# Patient Record
Sex: Male | Born: 1995 | Race: Black or African American | Hispanic: No | Marital: Single | Smoking: Never smoker
Health system: Southern US, Community
[De-identification: ages and names within clinical notes are randomized; demographics above are authoritative.]

---

## 2017-07-04 ENCOUNTER — Emergency Department (HOSPITAL_COMMUNITY): Payer: Self-pay

## 2017-07-04 ENCOUNTER — Encounter (HOSPITAL_COMMUNITY): Payer: Self-pay

## 2017-07-04 ENCOUNTER — Emergency Department (HOSPITAL_COMMUNITY)
Admission: EM | Admit: 2017-07-04 | Discharge: 2017-07-04 | Disposition: A | Discharge status: Home / Self Care | Payer: Self-pay | Attending: Emergency Medicine | Admitting: Emergency Medicine

## 2017-07-04 DIAGNOSIS — R0602 Shortness of breath: Secondary | ICD-10-CM | POA: Insufficient documentation

## 2017-07-04 MED ORDER — ALBUTEROL SULFATE (2.5 MG/3ML) 0.083% IN NEBU
2.5000 mg | INHALATION_SOLUTION | Freq: Once | RESPIRATORY_TRACT | Status: AC
  Administered 2017-07-04: 2.5 mg via RESPIRATORY_TRACT
  Filled 2017-07-04: qty 3

## 2017-07-04 MED ORDER — IPRATROPIUM-ALBUTEROL 0.5-2.5 (3) MG/3ML IN SOLN
3.0000 mL | Freq: Once | RESPIRATORY_TRACT | Status: AC
  Administered 2017-07-04: 3 mL via RESPIRATORY_TRACT
  Filled 2017-07-04: qty 3

## 2017-07-04 NOTE — ED Notes (Signed)
RT notified of orders 

## 2017-07-04 NOTE — ED Notes (Signed)
Pts transportation has to leave and pt states he needs to leave. Informed Dr Estell HarpinZammit.Per Dr zammit it will approx 30 mins before he will be reevaluated. Pt does not wish to stay and explained AMA and pt wants to sign out AMA

## 2017-07-04 NOTE — ED Triage Notes (Signed)
Pt states he has had shortness of breath since last night. Denies cough, congestion, fever. States he has been having night sweats for approx 1 month.

## 2017-07-05 NOTE — ED Provider Notes (Signed)
[facility]ENCY [facility]PARTMENT Provider Note   CSN: [id number] Arrival date & time: 07/04/17  [id number]     History   Chief Complaint Chief Complaint  Patient presents with  . Shortness of Breath    HPI Doran Heaterzaiah Kaplan is a 22 y.o. male.  Patient complains of some chest pain and shortness of breath.  The pain has improved but he still has some shortness of breath   The history is provided by the patient. No language interpreter was used.  Shortness of Breath  This is a new problem. The problem occurs rarely.The current episode started 12 to 24 hours ago. The problem has not changed since onset.Pertinent negatives include no fever, no headaches, no cough, no chest pain, no abdominal pain and no rash. It is unknown what precipitated the problem.    History reviewed. No pertinent past medical history.  There are no active problems to display for this patient.   History reviewed. No pertinent surgical history.     Home Medications    Prior to Admission medications   Not on File    Family History No family history on file.  Social History Social History   Tobacco Use  . Smoking status: Never Smoker  . Smokeless tobacco: Never Used  Substance Use Topics  . Alcohol use: No    Frequency: Never  . Drug use: No     Allergies   Pineapple; Amoxicillin; and Penicillins   Review of Systems Review of Systems  Constitutional: Negative for appetite change, fatigue and fever.  HENT: Negative for congestion, ear discharge and sinus pressure.   Eyes: Negative for discharge.  Respiratory: Positive for shortness of breath. Negative for cough.   Cardiovascular: Negative for chest pain.  Gastrointestinal: Negative for abdominal pain and diarrhea.  Genitourinary: Negative for frequency and hematuria.  Musculoskeletal: Negative for back pain.  Skin: Negative for rash.  Neurological: Negative for seizures and headaches.  Psychiatric/Behavioral: Negative for hallucinations.      Physical Exam Updated Vital Signs BP 124/84 (BP Location: Left Arm)   Pulse 62   Temp 98.1 F (36.7 C) (Oral)   Resp 16   Ht 6' 3.5" (1.918 m)   Wt 90.7 kg (200 lb)   SpO2 98%   BMI 24.67 kg/m   Physical Exam  Constitutional: He is oriented to person, place, and time. He appears well-developed.  HENT:  Head: Normocephalic.  Eyes: Conjunctivae and EOM are normal. No scleral icterus.  Neck: Neck supple. No thyromegaly present.  Cardiovascular: Normal rate and regular rhythm. Exam reveals no gallop and no friction rub.  No murmur heard. Pulmonary/Chest: No stridor. He has wheezes. He has no rales. He exhibits no tenderness.  Abdominal: He exhibits no distension. There is no tenderness. There is no rebound.  Musculoskeletal: Normal range of motion. He exhibits no edema.  Lymphadenopathy:    He has no cervical adenopathy.  Neurological: He is oriented to person, place, and time. He exhibits normal muscle tone. Coordination normal.  Skin: No rash noted. No erythema.  Psychiatric: He has a normal mood and affect. His behavior is normal.     ED Treatments / Results  Labs (all labs ordered are listed, but only abnormal results are displayed) Labs Reviewed - No data to display  EKG  EKG Interpretation None       Radiology Dg Chest 2 View  Result Date: 07/04/2017 CLINICAL DATA:  Shortness of Breath EXAM: CHEST  2 VIEW COMPARISON:  None. FINDINGS: Lungs are clear.  Heart size and pulmonary vascularity are normal. No adenopathy. No bone lesions. IMPRESSION: No edema or consolidation. Electronically Signed   By: Bretta Bang III M.D.   On: 07/04/2017 08:39    Procedures Procedures (including critical care time)  Medications Ordered in ED Medications  ipratropium-albuterol (DUONEB) 0.5-2.5 (3) MG/3ML nebulizer solution 3 mL (3 mLs Nebulization Given 07/04/17 1032)  albuterol (PROVENTIL) (2.5 MG/3ML) 0.083% nebulizer solution 2.5 mg (2.5 mg Nebulization Given  07/04/17 1032)     Initial Impression / Assessment and Plan / ED Course  I have reviewed the triage vital signs and the nursing notes.  Pertinent labs & imaging results that were available during my care of the patient were reviewed by me and considered in my medical decision making (see chart for details).     Patient had some chest pain after laughing.  His chest x-ray is unremarkable.  Patient also had some wheezing and was given a neb treatment but he left AMA directly after getting his treatment  Final Clinical Impressions(s) / ED Diagnoses   Final diagnoses:  None    ED Discharge Orders    None       Bethann Berkshire, MD 07/05/17 [id number]

## 2019-08-24 IMAGING — DX DG CHEST 2V
2 series · 2 of 2 positions shown · non-contrast
Comparison: None.

CLINICAL DATA: Shortness of Breath

EXAM:
CHEST  2 VIEW

[chest pa]
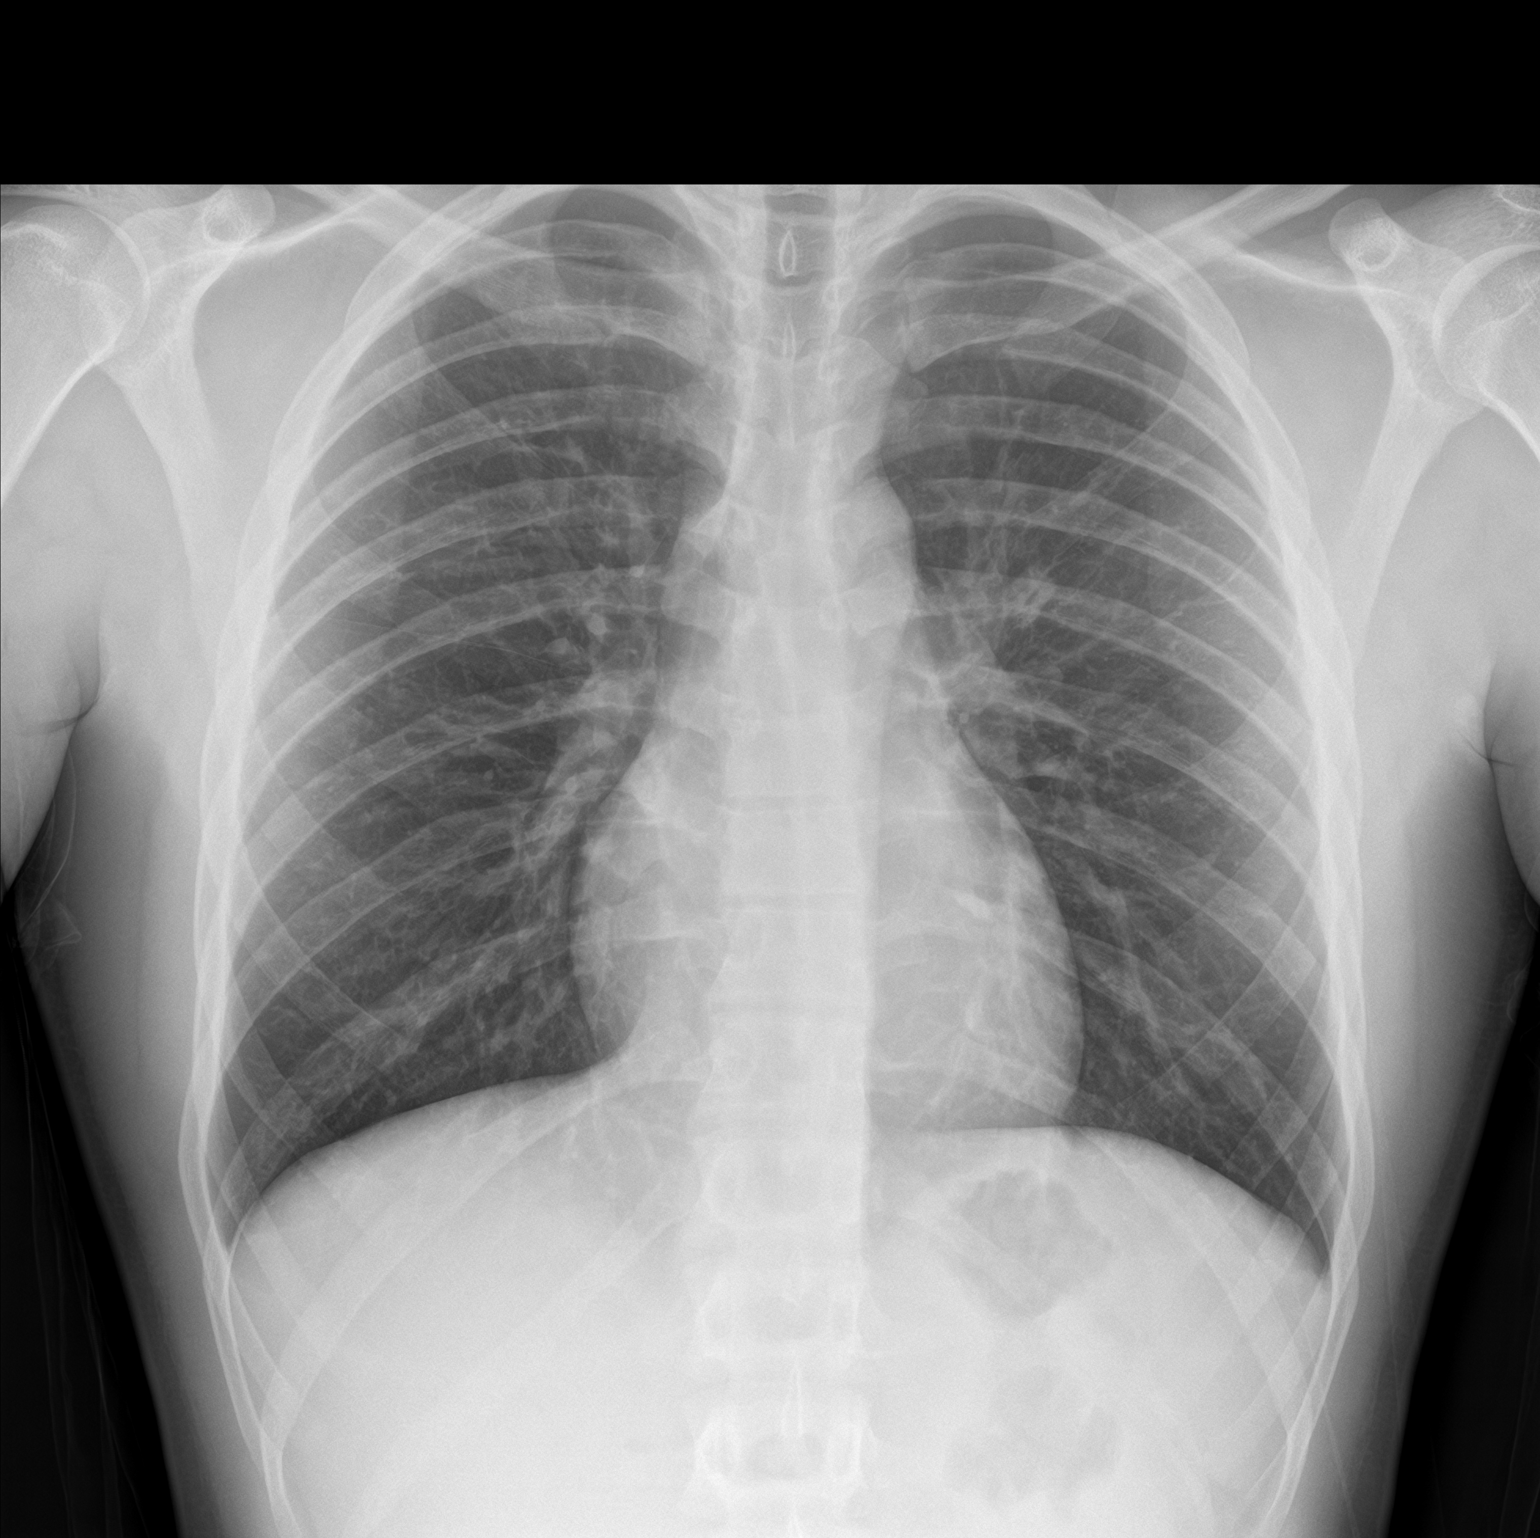

[chest lat]
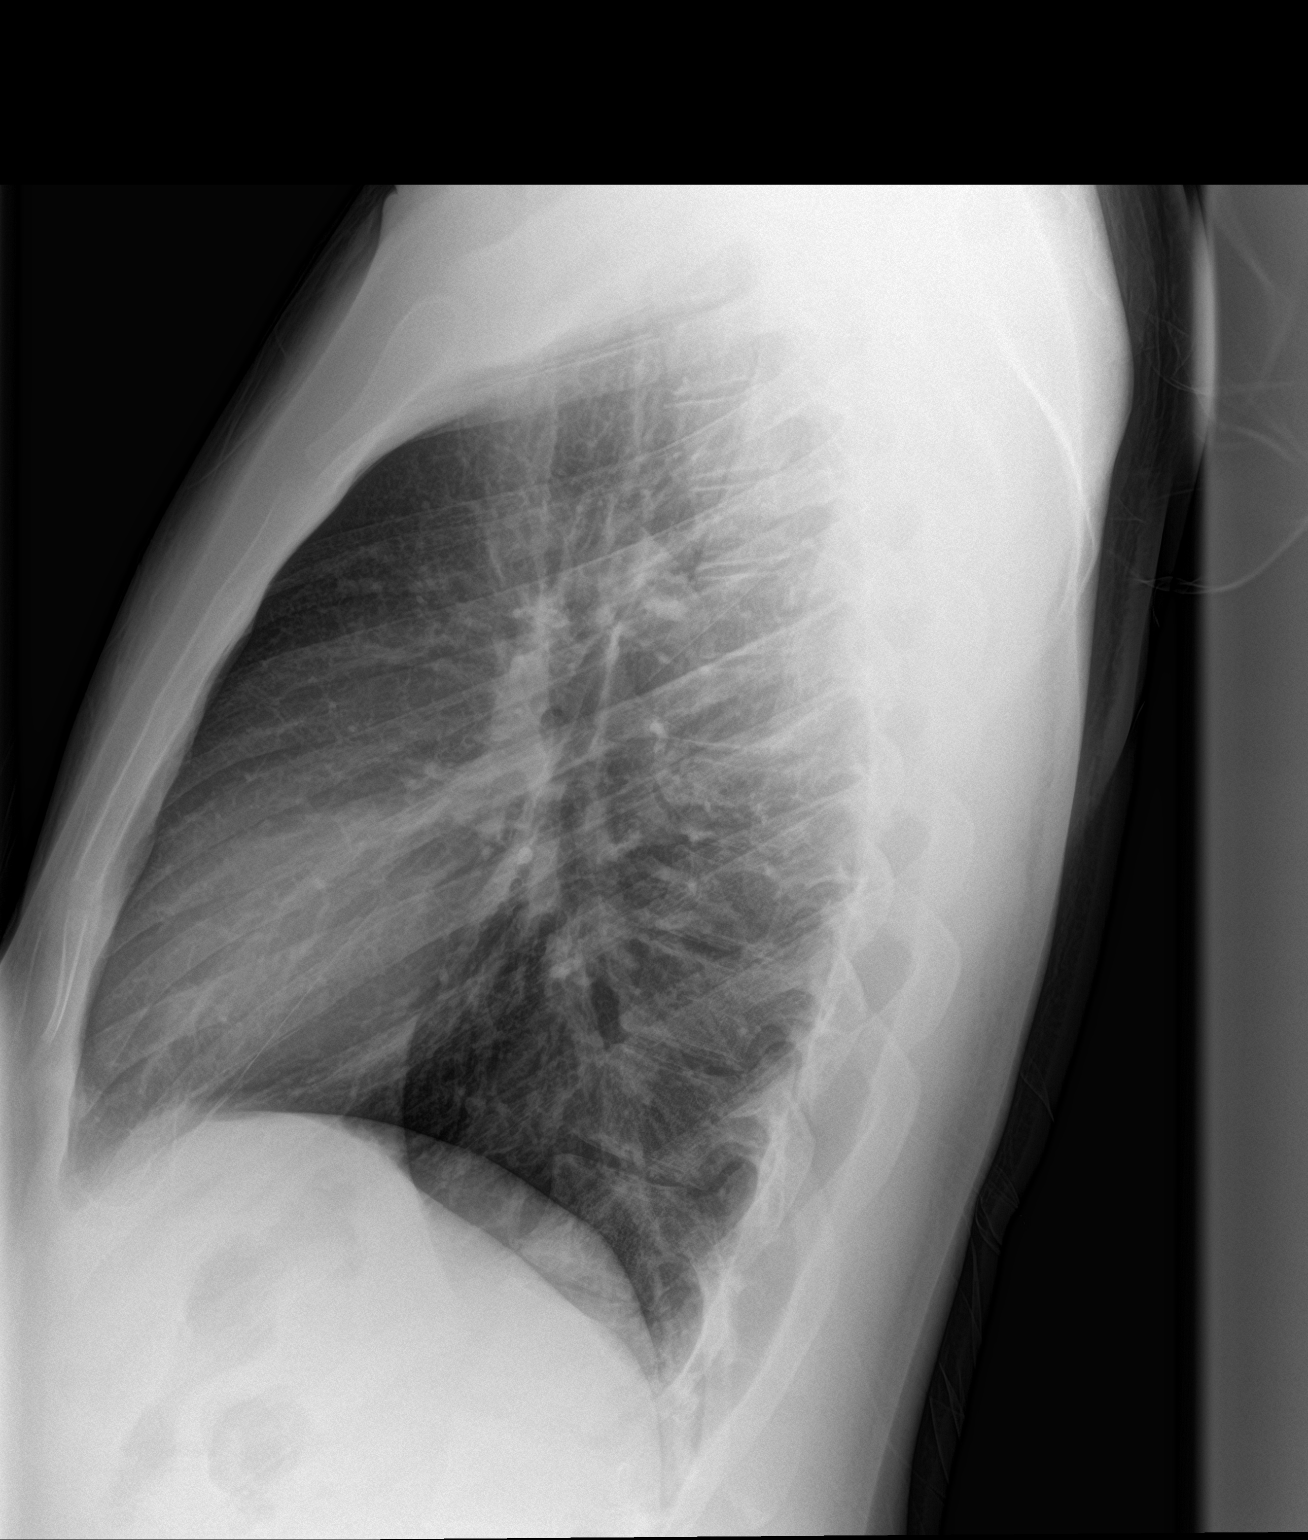

[2 of 2 positions shown; findings below may reference images not displayed]

FINDINGS: Lungs are clear. Heart size and pulmonary vascularity are normal. No
adenopathy. No bone lesions.
IMPRESSION: No edema or consolidation.
